# Patient Record
Sex: Female | Born: 1970 | Race: White | Hispanic: No | Marital: Married | Smoking: Never smoker
Health system: Southern US, Community
[De-identification: ages and names within clinical notes are randomized; demographics above are authoritative.]

## PROBLEM LIST (undated history)

## (undated) DIAGNOSIS — M51369 Other intervertebral disc degeneration, lumbar region without mention of lumbar back pain or lower extremity pain: Secondary | ICD-10-CM

## (undated) DIAGNOSIS — G950 Syringomyelia and syringobulbia: Secondary | ICD-10-CM

## (undated) DIAGNOSIS — J45909 Unspecified asthma, uncomplicated: Secondary | ICD-10-CM

## (undated) DIAGNOSIS — M5136 Other intervertebral disc degeneration, lumbar region: Secondary | ICD-10-CM

## (undated) HISTORY — PX: ANKLE SURGERY: SHX546

## (undated) HISTORY — DX: Syringomyelia and syringobulbia: G95.0

## (undated) HISTORY — PX: HIP SURGERY: SHX245

## (undated) HISTORY — DX: Other intervertebral disc degeneration, lumbar region without mention of lumbar back pain or lower extremity pain: M51.369

## (undated) HISTORY — DX: Other intervertebral disc degeneration, lumbar region: M51.36

---

## 2010-07-01 ENCOUNTER — Ambulatory Visit: Payer: Self-pay | Admitting: Family Medicine

## 2010-07-01 ENCOUNTER — Telehealth: Payer: Self-pay | Admitting: *Deleted

## 2010-07-01 DIAGNOSIS — Z0289 Encounter for other administrative examinations: Secondary | ICD-10-CM

## 2010-07-01 NOTE — Telephone Encounter (Signed)
No Show for new patient 30 min appt.  LMTCB with reason for no show on cell phone

## 2010-07-01 NOTE — Telephone Encounter (Signed)
noted 

## 2010-07-01 NOTE — Telephone Encounter (Signed)
Pt did not call back with reason for no -show

## 2017-10-12 ENCOUNTER — Other Ambulatory Visit: Payer: Self-pay | Admitting: Family Medicine

## 2017-10-12 DIAGNOSIS — Z1231 Encounter for screening mammogram for malignant neoplasm of breast: Secondary | ICD-10-CM

## 2019-08-27 ENCOUNTER — Other Ambulatory Visit: Payer: Self-pay

## 2019-08-27 ENCOUNTER — Other Ambulatory Visit (HOSPITAL_COMMUNITY): Payer: Self-pay | Admitting: Family Medicine

## 2019-08-27 ENCOUNTER — Ambulatory Visit (HOSPITAL_COMMUNITY)
Admission: RE | Admit: 2019-08-27 | Discharge: 2019-08-27 | Disposition: A | Discharge status: Home / Self Care | Payer: 59 | Source: Ambulatory Visit | Attending: Family Medicine | Admitting: Family Medicine

## 2019-08-27 DIAGNOSIS — M545 Low back pain, unspecified: Secondary | ICD-10-CM

## 2019-08-27 DIAGNOSIS — R0602 Shortness of breath: Secondary | ICD-10-CM

## 2021-05-06 IMAGING — DX DG LUMBAR SPINE COMPLETE 4+V
5 series · 5 of 5 positions shown · non-contrast
Comparison: None.

CLINICAL DATA: Low back pain.

EXAM:
LUMBAR SPINE - COMPLETE 4+ VIEW

[l-spine ap]
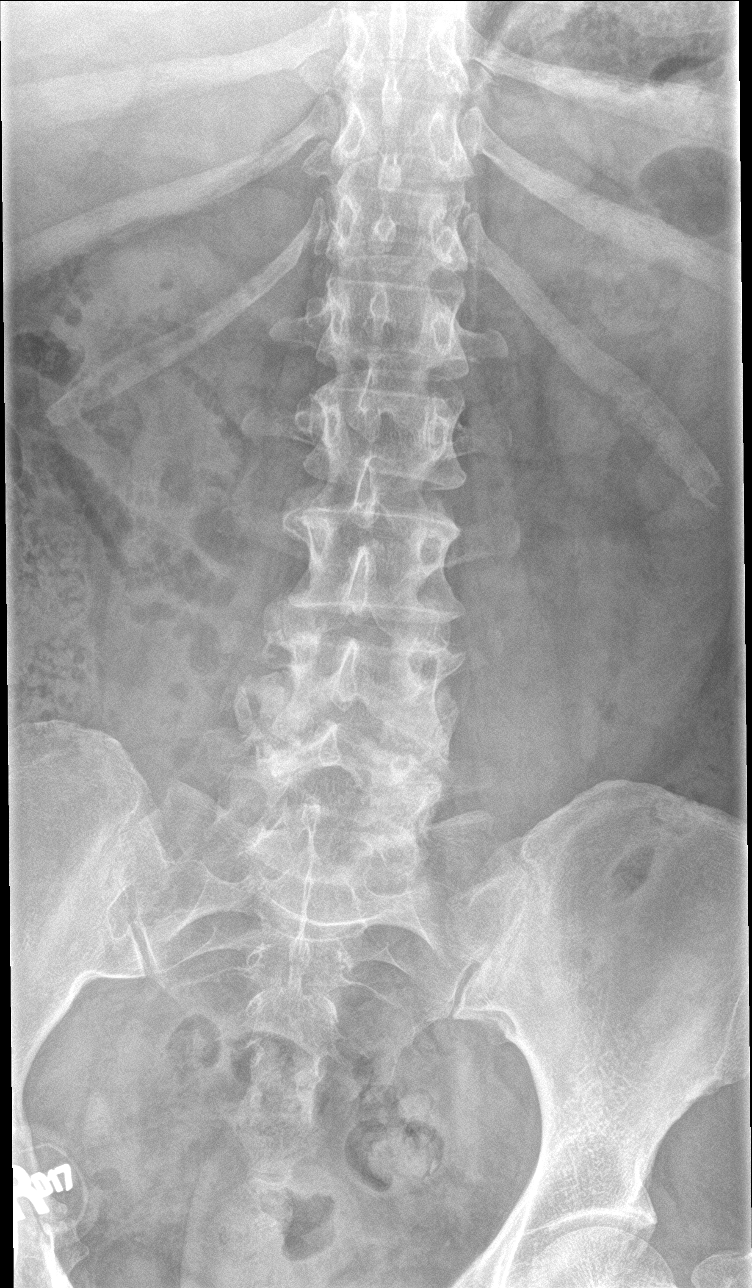

[l-spine obl (1 of 2)]
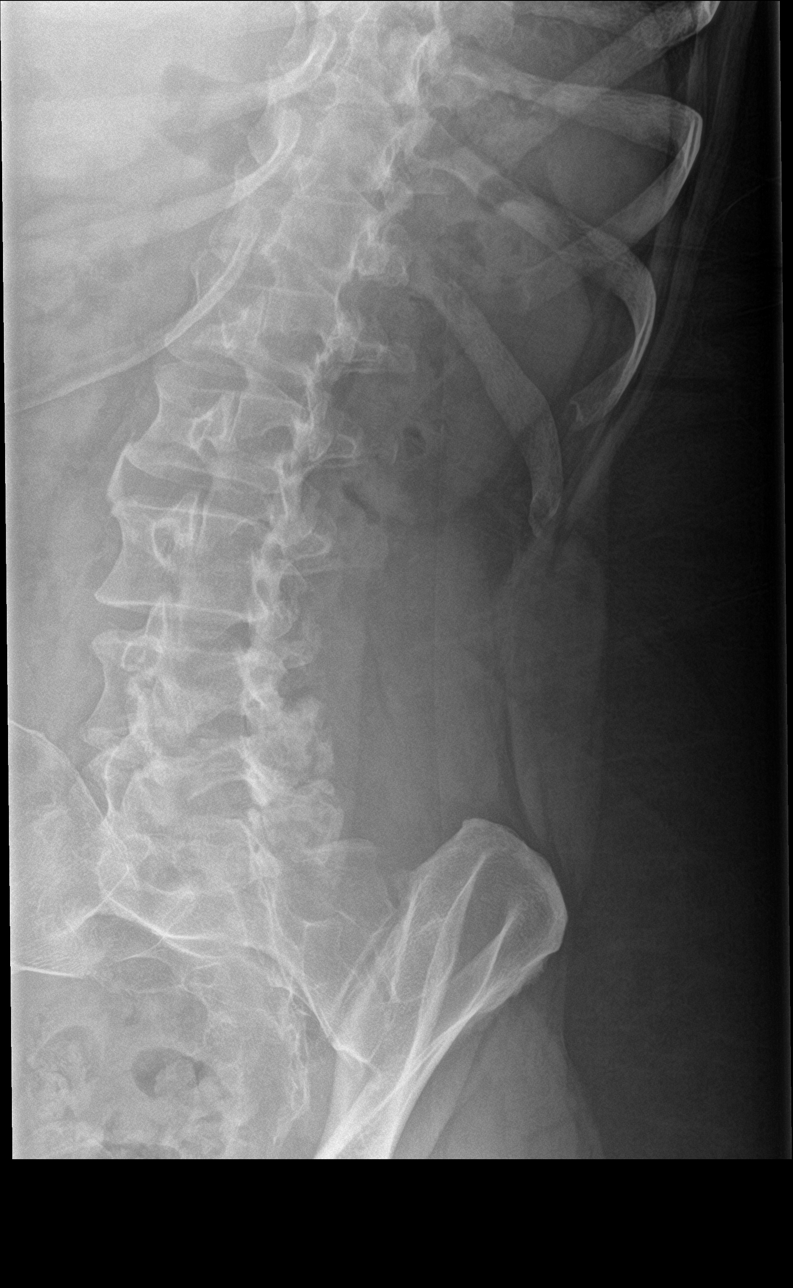

[l-spine obl (2 of 2)]
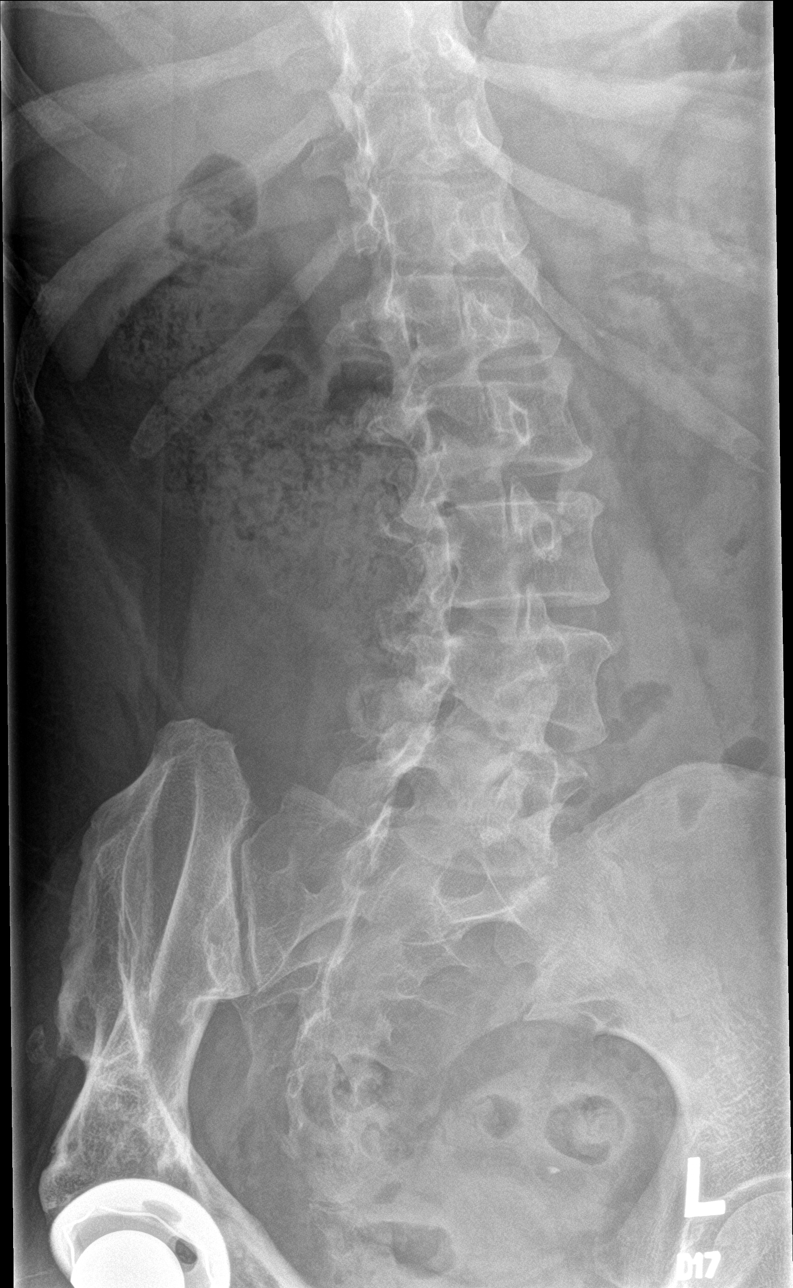

[l-spine lat]
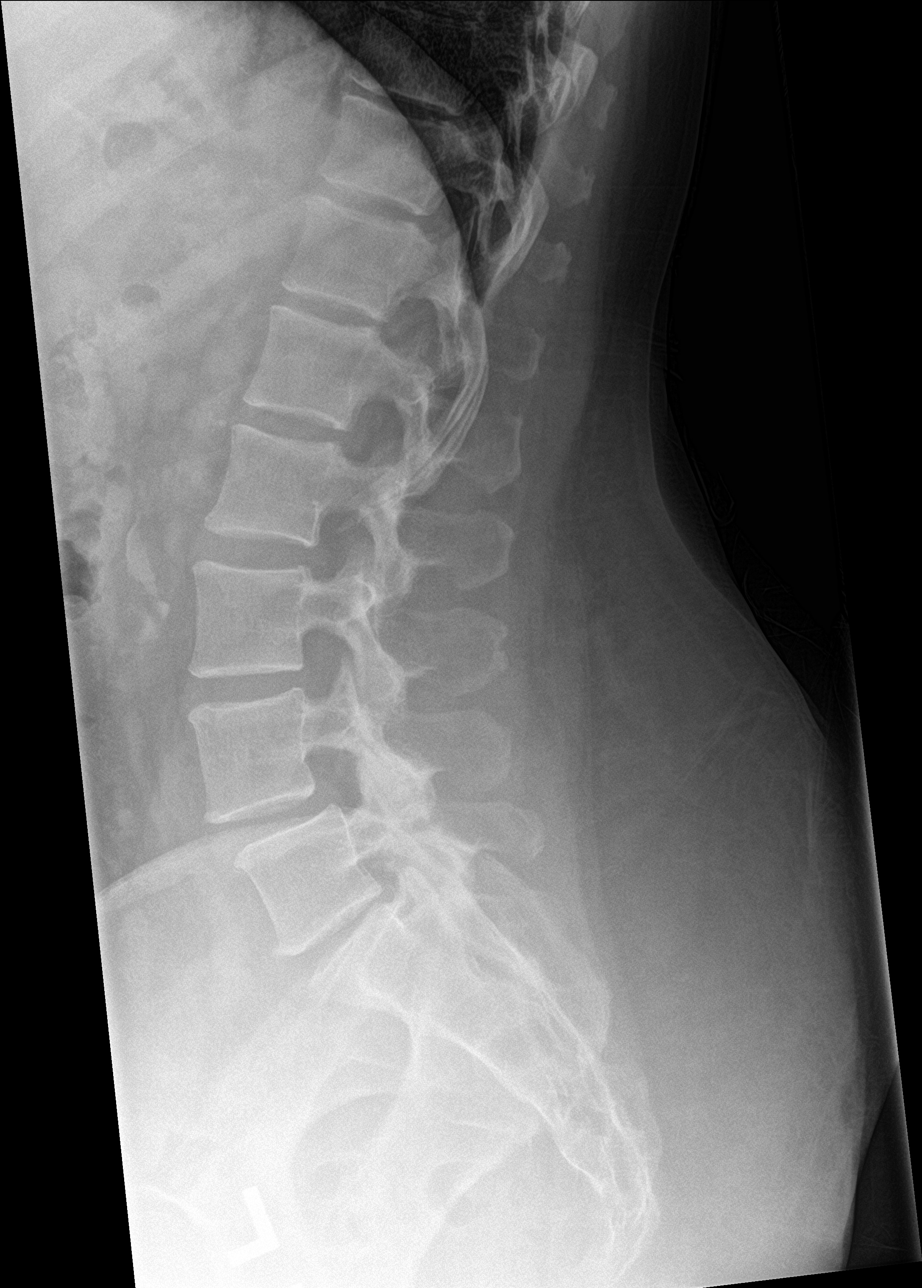

[l-spine spot]
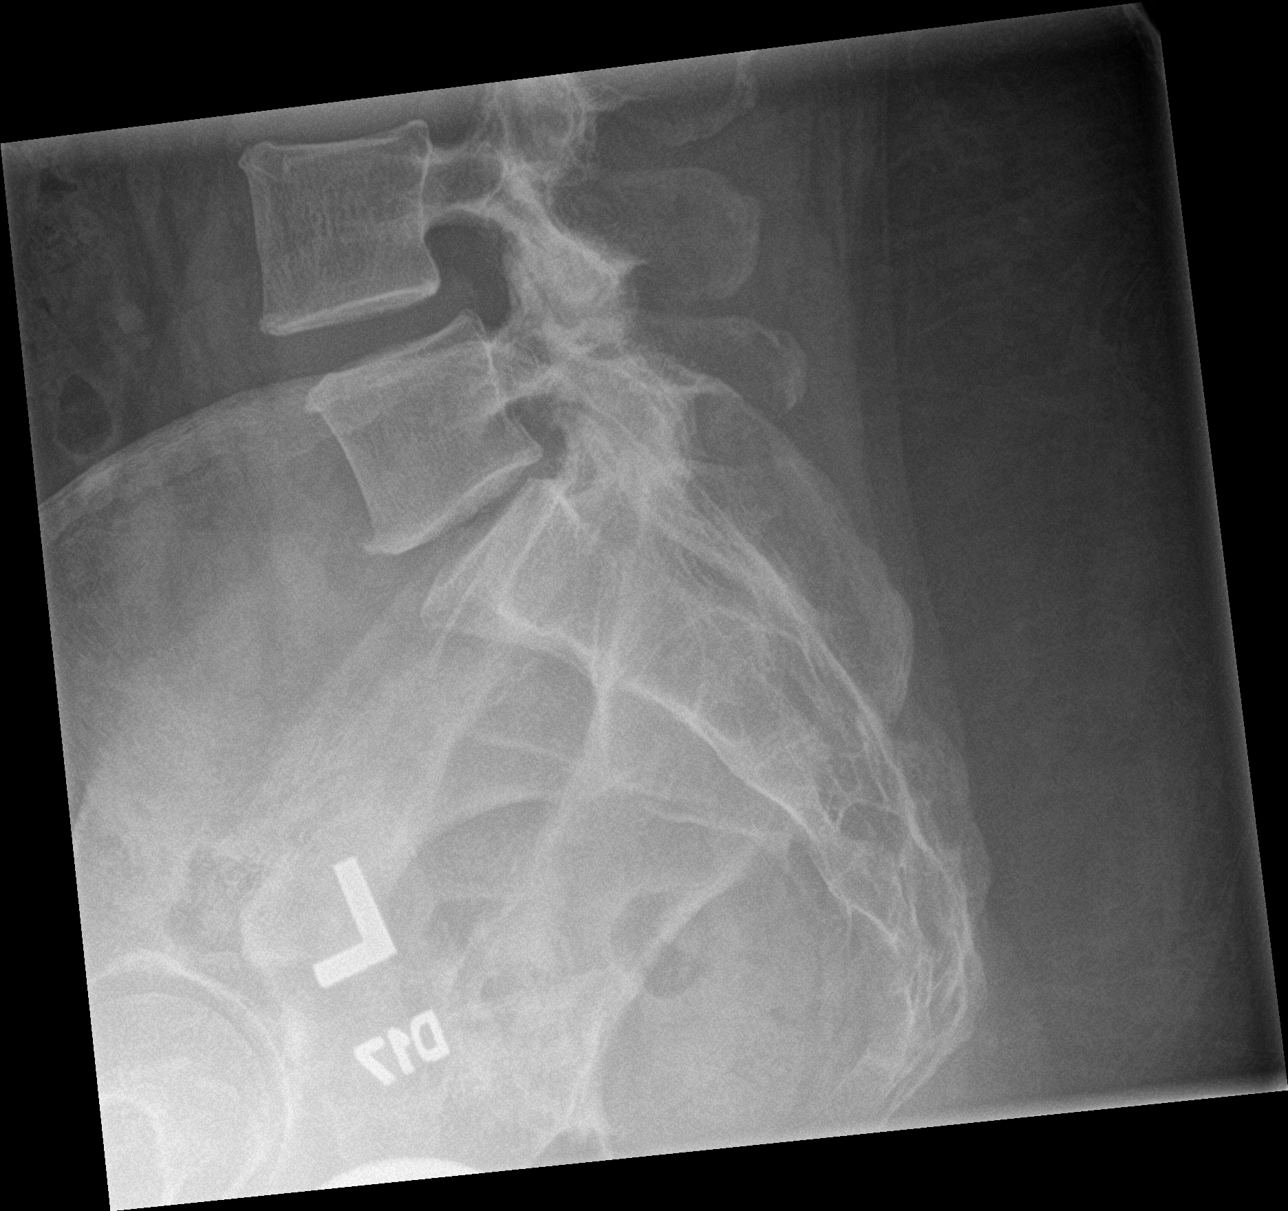

[5 of 5 positions shown; findings below may reference images not displayed]

FINDINGS: Five lumbar type vertebral bodies. No acute fracture or subluxation.
Vertebral body heights are preserved.

4 mm anterolisthesis at L4-L5. Mild disc height loss at L5-S1.
Remaining intervertebral disc spaces are maintained. Moderate
bilateral facet arthropathy at L4-L5.

The sacroiliac joints are unremarkable.
IMPRESSION: 1. Mild degenerative disc disease at L5-S1.
2. Moderate facet arthropathy at L4-L5 with grade 1 anterolisthesis.

## 2021-08-11 ENCOUNTER — Emergency Department (HOSPITAL_COMMUNITY): Payer: 59

## 2021-08-11 ENCOUNTER — Emergency Department (HOSPITAL_COMMUNITY)
Admission: EM | Admit: 2021-08-11 | Discharge: 2021-08-11 | Disposition: A | Discharge status: Home / Self Care | Payer: 59 | Attending: Emergency Medicine | Admitting: Emergency Medicine

## 2021-08-11 ENCOUNTER — Encounter (HOSPITAL_COMMUNITY): Payer: Self-pay | Admitting: Emergency Medicine

## 2021-08-11 ENCOUNTER — Other Ambulatory Visit: Payer: Self-pay

## 2021-08-11 DIAGNOSIS — K5792 Diverticulitis of intestine, part unspecified, without perforation or abscess without bleeding: Secondary | ICD-10-CM | POA: Insufficient documentation

## 2021-08-11 DIAGNOSIS — K529 Noninfective gastroenteritis and colitis, unspecified: Secondary | ICD-10-CM | POA: Insufficient documentation

## 2021-08-11 DIAGNOSIS — R1084 Generalized abdominal pain: Secondary | ICD-10-CM | POA: Diagnosis present

## 2021-08-11 DIAGNOSIS — D72829 Elevated white blood cell count, unspecified: Secondary | ICD-10-CM | POA: Insufficient documentation

## 2021-08-11 HISTORY — DX: Unspecified asthma, uncomplicated: J45.909

## 2021-08-11 LAB — COMPREHENSIVE METABOLIC PANEL
ALT: 28 U/L (ref 0–44)
AST: 26 U/L (ref 15–41)
Albumin: 4.2 g/dL (ref 3.5–5.0)
Alkaline Phosphatase: 79 U/L (ref 38–126)
Anion gap: 10 (ref 5–15)
BUN: 10 mg/dL (ref 6–20)
CO2: 26 mmol/L (ref 22–32)
Calcium: 9.1 mg/dL (ref 8.9–10.3)
Chloride: 103 mmol/L (ref 98–111)
Creatinine, Ser: 0.6 mg/dL (ref 0.44–1.00)
GFR, Estimated: >60 mL/min (ref >60)
Glucose, Bld: 100 mg/dL — ABNORMAL HIGH (ref 70–99)
Potassium: 3.8 mmol/L (ref 3.5–5.1)
Sodium: 139 mmol/L (ref 135–145)
Total Bilirubin: 0.1 mg/dL — ABNORMAL LOW (ref 0.3–1.2)
Total Protein: 8.1 g/dL (ref 6.5–8.1)

## 2021-08-11 LAB — CBC
HCT: 40.9 % (ref 36.0–46.0)
Hemoglobin: 13.4 g/dL (ref 12.0–15.0)
MCH: 28.8 pg (ref 26.0–34.0)
MCHC: 32.8 g/dL (ref 30.0–36.0)
MCV: 87.8 fL (ref 80.0–100.0)
Platelets: 466 10*3/uL — ABNORMAL HIGH (ref 150–400)
RBC: 4.66 MIL/uL (ref 3.87–5.11)
RDW: 12.9 % (ref 11.5–15.5)
WBC: 11 10*3/uL — ABNORMAL HIGH (ref 4.0–10.5)
nRBC: 0 % (ref 0.0–0.2)

## 2021-08-11 LAB — URINALYSIS, ROUTINE W REFLEX MICROSCOPIC
Bilirubin Urine: NEGATIVE
Glucose, UA: NEGATIVE mg/dL
Ketones, ur: NEGATIVE mg/dL
Leukocytes,Ua: NEGATIVE
Nitrite: NEGATIVE
Protein, ur: NEGATIVE mg/dL
Specific Gravity, Urine: 1.005 (ref 1.005–1.030)
pH: 6 (ref 5.0–8.0)

## 2021-08-11 LAB — LIPASE, BLOOD: Lipase: 45 U/L (ref 11–51)

## 2021-08-11 MED ORDER — IOHEXOL 300 MG/ML  SOLN
100.0000 mL | Freq: Once | INTRAMUSCULAR | Status: AC | PRN
  Administered 2021-08-11: 100 mL via INTRAVENOUS

## 2021-08-11 MED ORDER — HYDROCODONE-ACETAMINOPHEN 5-325 MG PO TABS: 1.0000 | ORAL_TABLET | ORAL | 0 refills | Status: AC | PRN

## 2021-08-11 MED ORDER — AMOXICILLIN-POT CLAVULANATE 875-125 MG PO TABS: 1.0000 | ORAL_TABLET | Freq: Two times a day (BID) | ORAL | 0 refills | Status: AC

## 2021-08-11 MED ORDER — ONDANSETRON HCL 4 MG/2ML IJ SOLN
4.0000 mg | Freq: Once | INTRAMUSCULAR | Status: AC
  Administered 2021-08-11: 4 mg via INTRAVENOUS
  Filled 2021-08-11: qty 2

## 2021-08-11 MED ORDER — ONDANSETRON 4 MG PO TBDP: 4.0000 mg | ORAL_TABLET | Freq: Three times a day (TID) | ORAL | 0 refills | Status: AC | PRN

## 2021-08-11 MED ORDER — AMOXICILLIN-POT CLAVULANATE 875-125 MG PO TABS
1.0000 | ORAL_TABLET | Freq: Once | ORAL | Status: AC
Start: 2021-08-11 — End: 2021-08-11
  Administered 2021-08-11: 1 via ORAL
  Filled 2021-08-11: qty 1

## 2021-08-11 MED ORDER — MORPHINE SULFATE (PF) 4 MG/ML IV SOLN
4.0000 mg | Freq: Once | INTRAVENOUS | Status: AC
  Administered 2021-08-11: 4 mg via INTRAVENOUS
  Filled 2021-08-11: qty 1

## 2021-08-11 NOTE — ED Triage Notes (Signed)
Pt having left-sided abdominal pain that started on Sunday.

## 2021-08-11 NOTE — Discharge Instructions (Addendum)
Return if any problems.  Schedule to see Gi for evaltuion

## 2021-08-11 NOTE — ED Provider Notes (Signed)
[facility]Y [facility]PART[facility] Provider Note   CSN: [id number] Arrival date & time: 08/11/21  1555     History  Chief Complaint  Patient presents with   Abdominal Pain    Sarah Lopez is a 51 y.o. female.  Pt complains of pain in her left lower abdomen.  Pt reports severe cramping pain  that is worsening   The history is provided by the patient. No language interpreter was used.  Abdominal Pain Pain location:  Generalized Pain quality: aching   Pain radiates to:  Does not radiate Pain severity:  Moderate Onset quality:  Sudden Duration:  3 days Timing:  Constant Progression:  Worsening Chronicity:  New Context: not recent illness and not sick contacts   Relieved by:  Nothing Worsened by:  Nothing Ineffective treatments:  None tried Associated symptoms: no anorexia and no fever   Risk factors: no alcohol abuse       Home Medications Prior to Admission medications   Not on File      Allergies    Aspirin and Percocet [oxycodone-acetaminophen]    Review of Systems   Review of Systems  Constitutional:  Negative for fever.  Gastrointestinal:  Positive for abdominal pain. Negative for anorexia.  All other systems reviewed and are negative.  Physical Exam Updated Vital Signs BP (!) 155/81 (BP Location: Left Arm)   Pulse 98   Temp 98 F (36.7 C) (Oral)   Resp 18   Ht 5\' 4"  (1.626 m)   Wt 86.2 kg   LMP 07/13/2021 (Approximate)   SpO2 100%   BMI 32.61 kg/m  Physical Exam Vitals and nursing note reviewed.  Constitutional:      Appearance: She is well-developed.  HENT:     Head: Normocephalic.  Eyes:     Extraocular Movements: Extraocular movements intact.  Cardiovascular:     Rate and Rhythm: Normal rate and regular rhythm.  Pulmonary:     Effort: Pulmonary effort is normal.  Abdominal:     General: Abdomen is flat. There is abdominal bruit. There is no distension.     Palpations: Abdomen is soft.     Tenderness: There is abdominal tenderness in  the right lower quadrant.  Musculoskeletal:        General: Normal range of motion.     Cervical back: Normal range of motion.  Skin:    General: Skin is warm.  Neurological:     General: No focal deficit present.     Mental Status: She is alert and oriented to person, place, and time.    ED Results / Procedures / Treatments   Labs (all labs ordered are listed, but only abnormal results are displayed) Labs Reviewed  COMPREHENSIVE METABOLIC PANEL - Abnormal; Notable for the following components:      Result Value   Glucose, Bld 100 (*)    Total Bilirubin 0.1 (*)    All other components within normal limits  CBC - Abnormal; Notable for the following components:   WBC 11.0 (*)    Platelets 466 (*)    All other components within normal limits  URINALYSIS, ROUTINE W REFLEX MICROSCOPIC - Abnormal; Notable for the following components:   Color, Urine STRAW (*)    Hgb urine dipstick MODERATE (*)    Bacteria, UA RARE (*)    All other components within normal limits  LIPASE, BLOOD    EKG None  Radiology CT ABDOMEN PELVIS W CONTRAST  Result Date: 08/11/2021 CLINICAL DATA:  Left lower quadrant  abdominal pain. EXAM: CT ABDOMEN AND PELVIS WITH CONTRAST TECHNIQUE: Multidetector CT imaging of the abdomen and pelvis was performed using the standard protocol following bolus administration of intravenous contrast. RADIATION DOSE REDUCTION: This exam was performed according to the departmental dose-optimization program which includes automated exposure control, adjustment of the mA and/or kV according to patient size and/or use of iterative reconstruction technique. CONTRAST:  OMNIPAQUE IOHEXOL 300 MG/ML  SOLN COMPARISON:  None Available. FINDINGS: Lower chest: No acute abnormality. Hepatobiliary: No suspicious hepatic lesion. Cholelithiasis without findings of acute cholecystitis. No biliary ductal dilation. Pancreas: No pancreatic ductal dilation or evidence of acute inflammation. Spleen:  No splenomegaly or focal splenic lesion. Adrenals/Urinary Tract: Bilateral adrenal glands appear normal. No hydronephrosis. Kidneys demonstrate symmetric enhancement and excretion of contrast material. Urinary bladder is unremarkable for degree of distension. Stomach/Bowel: No radiopaque enteric contrast material was administered. Stomach is unremarkable for degree of distension. No pathologic dilation of small or large bowel. Left-sided colonic diverticulosis with descending colonic diverticulitis. Vascular/Lymphatic: Aortic atherosclerosis without aneurysmal dilation. No pathologically enlarged abdominal or pelvic lymph nodes. Reproductive: Endometrial thickening with fluid in the endometrial canal. No suspicious adnexal mass. Other: No significant abdominopelvic free fluid. No walled off fluid collections. No pneumoperitoneum. 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 L5-S1 discogenic disease. IMPRESSION: 1. Acute uncomplicated descending colonic diverticulitis. 2. Endometrial thickening with fluid in the endometrial canal, nonspecific and poorly evaluated on CT imaging. Consider further evaluation with nonemergent pelvic ultrasound. 3. 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 4. Cholelithiasis without findings of acute cholecystitis. 5. Aortic Atherosclerosis (ICD10-I70.0). Electronically Signed   By: Maudry Mayhew M.D.   On: 08/11/2021 18:46    Procedures Procedures    Medications Ordered in ED Medications  morphine (PF) 4 MG/ML injection 4 mg (4 mg Intravenous Given 08/11/21 1800)  ondansetron (ZOFRAN) injection 4 mg (4 mg Intravenous Given 08/11/21 1759)  iohexol (OMNIPAQUE) 300 MG/ML solution 100 mL (100 mLs Intravenous Contrast Given 08/11/21 1825)     ED Course/ Medical Decision Making/ A&P                           Medical Decision Making Pt complains of pain left lower abdomen   Amount and/or Complexity of Data Reviewed Labs: ordered. Decision-making details documented in ED Course.    Details: Labs ordered reviewed and interpreted patient has a  white blood cell count of 11 hemoglobin is normal.  UA hemoglobin moderate bacteria Radiology: ordered and independent interpretation performed. Decision-making details documented in ED Course.    Details: CT abdomen shows diverticulitis and colitis without evidence of cholecystitis.  Risk Prescription drug management. Risk Details: Differential diagnosis includes kidney stone possible pain from hydronephrosis, diverticulitis, colitis.   The scan showed diverticulitis patient is given a prescription for 875 number 21 tablet p.o. twice daily patient is referred to GI for follow-up is given a prescription for Zofran for nausea and hydrocodone for pain           Final Clinical Impression(s) / ED Diagnoses Final diagnoses:  Diverticulitis    Rx / DC Orders ED Discharge Orders          Ordered    amoxicillin-clavulanate (AUGMENTIN) 875-125 MG tablet  2 times daily        08/11/21  1918    ondansetron (ZOFRAN-ODT) 4 MG disintegrating tablet  Every 8 hours PRN        08/11/21 1918    HYDROcodone-acetaminophen (NORCO/VICODIN) 5-325 MG tablet  Every 4 hours PRN        08/11/21 1918          An After Visit Summary was printed and given to the patient.     Elson Areas, PA-C 08/11/21 1919    Edwin Dada P, DO 08/16/21 2328

## 2021-08-20 ENCOUNTER — Other Ambulatory Visit (INDEPENDENT_AMBULATORY_CARE_PROVIDER_SITE_OTHER): Payer: Self-pay

## 2021-08-20 ENCOUNTER — Encounter (INDEPENDENT_AMBULATORY_CARE_PROVIDER_SITE_OTHER): Payer: Self-pay | Admitting: Gastroenterology

## 2021-08-20 ENCOUNTER — Ambulatory Visit (INDEPENDENT_AMBULATORY_CARE_PROVIDER_SITE_OTHER): Payer: 59 | Admitting: Gastroenterology

## 2021-08-20 ENCOUNTER — Telehealth (INDEPENDENT_AMBULATORY_CARE_PROVIDER_SITE_OTHER): Payer: Self-pay

## 2021-08-20 ENCOUNTER — Encounter (INDEPENDENT_AMBULATORY_CARE_PROVIDER_SITE_OTHER): Payer: Self-pay

## 2021-08-20 VITALS — BP 136/71 | HR 102 | Temp 98.0°F | Ht 64.0 in | Wt 204.7 lb

## 2021-08-20 DIAGNOSIS — K5732 Diverticulitis of large intestine without perforation or abscess without bleeding: Secondary | ICD-10-CM

## 2021-08-20 MED ORDER — PEG 3350-KCL-NA BICARB-NACL 420 G PO SOLR: 4000.0000 mL | ORAL | 0 refills | Status: AC

## 2021-08-20 NOTE — Progress Notes (Signed)
Referring Provider: No ref. provider found Primary Care Physician:  Pcp, No Primary GI Physician: new  Chief Complaint  Patient presents with   Diverticulitis    Hospital follow up on diverticulitis. Pain is better. Having some soreness, having nausea, diarrhea. Has noticed red meat and lettuce has caused diarrhea within 10 minutes of eating.    HPI:   Sarah Lopez is a 51 y.o. female with past medical history of asthma, DDD, syringomyelia.  Patient presenting today as a new patient for diverticulitis.  Patient with recent ER visit 08/01/21 with c/o LLQ pain x3 days, CT A/P with contrast at that time showed Acute uncomplicated descending colonic diverticulitis. Patient was treated with  Augmentin BID  Patient states that prior to her ER visit, she had sudden onset of lower abdominal cramping with worsening pain on the left side. She states that her husband was out of town, when she drove to [place] to get him, every bump she drove over made her pain worse, she proceeded to the ER for further evaluation with findings as above. She reports that pain is improving some. Denies rectal bleeding or black stools. She is slowly reintroducing foods as she was on a liquid diet previously. She states that she may have up to 6 BMs per day since acute illness. At baseline she has 1-2 BMs per day. Stools are anywhere from loose to formed. She reports that she previously thought she had an allergy to beef as eating this made her have diarrhea. She ate beef, sunflower seeds and some veggies just prior to her acute episode. Denies any fevers or chills during acute episode. Did have a lot of nausea with her belly pain but no vomiting. Has been taking zofran that was given to her in the ED but she is trying to avoid taking nausea or pain meds too often.    NSAID use: taking ibuprofen occasionally, will sometimes take 2 ibuprofen for pain Social hx: no tobacco, rare etoh.  Fam hx: no crc or liver disease.    Last Colonoscopy: never Last Endoscopy:  Recommendations:    Past Medical History:  Diagnosis Date   Asthma    Degenerative disc disease, lumbar    Syringomyelia (HCC)     Current Outpatient Medications  Medication Sig Dispense Refill   amoxicillin-clavulanate (AUGMENTIN) 875-125 MG tablet Take 1 tablet by mouth 2 (two) times daily. 20 tablet 0   ondansetron (ZOFRAN-ODT) 4 MG disintegrating tablet Take 1 tablet (4 mg total) by mouth every 8 (eight) hours as needed for nausea or vomiting. 20 tablet 0   HYDROcodone-acetaminophen (NORCO/VICODIN) 5-325 MG tablet Take 1 tablet by mouth every 4 (four) hours as needed for moderate pain. (Patient not taking: Reported on 08/20/2021) 20 tablet 0   No current facility-administered medications for this visit.    Allergies as of 08/20/2021 - Review Complete 08/20/2021  Allergen Reaction Noted   Aspirin Nausea And Vomiting 08/11/2021   Hydrocodone  08/20/2021   Percocet [oxycodone-acetaminophen] Other (See Comments) 08/11/2021   Pineapple  08/20/2021    No family history on file.  Social History   Socioeconomic History   Marital status: Married    Spouse name: Not on file   Number of children: Not on file   Years of education: Not on file   Highest education level: Not on file  Occupational History   Not on file  Tobacco Use   Smoking status: Never    Passive exposure: Past   Smokeless tobacco: Never  Tobacco comments:    Smoked a couple of cigarrettes as a teenager  Vaping Use   Vaping Use: Never used  Substance and Sexual Activity   Alcohol use: Not Currently   Drug use: Never   Sexual activity: Not on file  Other Topics Concern   Not on file  Social History Narrative   Not on file   Social Determinants of Health   Financial Resource Strain: Not on file  Food Insecurity: Not on file  Transportation Needs: Not on file  Physical Activity: Not on file  Stress: Not on file  Social Connections: Not on file    Review of systems General: negative for malaise, night sweats, fever, chills, weight loss Neck: Negative for lumps, goiter, pain and significant neck swelling Resp: Negative for cough, wheezing, dyspnea at rest CV: Negative for chest pain, leg swelling, palpitations, orthopnea GI: denies melena, hematochezia, nausea, vomiting, dysphagia, odyonophagia, early satiety or unintentional weight loss. +mid to LLQ abdominal pain +looser to formed stools MSK: Negative for joint pain or swelling, back pain, and muscle pain. Derm: Negative for itching or rash Psych: Denies depression, anxiety, memory loss, confusion. No homicidal or suicidal ideation.  Heme: Negative for prolonged bleeding, bruising easily, and swollen nodes. Endocrine: Negative for cold or heat intolerance, polyuria, polydipsia and goiter. Neuro: negative for tremor, gait imbalance, syncope and seizures. The remainder of the review of systems is noncontributory.  Physical Exam: BP 136/71 (BP Location: Right Arm, Patient Position: Sitting, Cuff Size: Large)   Pulse (!) 102   Temp 98 F (36.7 C) (Oral)   Ht 5\' 4"  (1.626 m)   Wt 204 lb 11.2 oz (92.9 kg)   LMP 07/13/2021 (Approximate)   BMI 35.14 kg/m  General:   Alert and oriented. No distress noted. Pleasant and cooperative.  Head:  Normocephalic and atraumatic. Eyes:  Conjuctiva clear without scleral icterus. Mouth:  Oral mucosa pink and moist. Good dentition. No lesions. Heart: Normal rate and rhythm, s1 and s2 heart sounds present.  Lungs: Clear lung sounds in all lobes. Respirations equal and unlabored. Abdomen:  +BS, soft, non distended. TTP of mid to LLQ.  No rebound or guarding. No HSM or masses noted. Derm: No palmar erythema or jaundice Msk:  Symmetrical without gross deformities. Normal posture. Extremities:  Without edema. Neurologic:  Alert and  oriented x4 Psych:  Alert and cooperative. Normal mood and affect.  Invalid input(s): "6 MONTHS"    ASSESSMENT: Sarah Lopez is a 51 y.o. female presenting today as a new patient for diverticulitis.  Acute onset of lower abdominal cramping, worse in LLQ at the end of May with CT with findings of uncomplicated descending diverticulitis, started on Augmentin BID x10 days. Patient continues to have some mid to LLQ pain, ranging with solid to looser stools without rectal bleeding or melena, though pain is improving. She is doing a low fiber/bland/soft diet at this time and should continue this until resolution of symptoms, at that time she can transition to a high fiber diet. I discussed that recent studies indicate no correlation with seeds/nuts and development of acute diverticulitis, she is okay to proceed with consuming these once she is feeling better as long as she tolerates them okay. She uses ibuprofen on occasion, should avoid NSAIDs for now, can use sparingly once acute episode resolved. We discussed indications of proceeding with colonoscopy 6-8 weeks after acute diverticulitis as in rare cases malignancy can masquerade as diverticulitis. Notably she has never had a colonoscopy in the past,  she denies any family history of CRC. Indications, risks and benefits of procedure discussed in detail with patient. Patient verbalized understanding and is in agreement to proceed with Colonoscopy at this time. She will make me aware if she has new or worsening GI symptoms.    PLAN:  Plan for colonoscopy after July 11th 2. Soft diet, can transition to high fiber diet one symptoms completely resolved 3. Can do phazyme or gas x for gas 4. Avoid NSAIDs during acute episode, use very sparingly thereafter.  All questions were answered, patient verbalized understanding and is in agreement with plan as outlined above.   Follow Up: 4 months  Antrice Pal L. Jeanmarie Hubert, MSN, APRN, AGNP-C Adult-Gerontology Nurse Practitioner [facility]

## 2021-08-20 NOTE — Telephone Encounter (Signed)
Nolene Rocks Ann Gabino Hagin, CMA  ?

## 2021-08-20 NOTE — Patient Instructions (Signed)
We will plan for colonoscopy after July 11th As discussed it is important to follow an acute episode of diverticulitis with a colonoscopy to ensure healing and that there is no underlying malignancy(this is very rare). Please complete all antibiotics that you were given You should continue soft/bland/low fiber diet until you are no longer having abdominal pain or looser stools After resolution of diverticulitis, you should try to eat a high fiber diet as this is helpful in preventing further episodes of diverticulitis Recent studies show that there is no correlation between diverticulitis and seeds/nuts, if you tolerate these okay, you can resume consumption of these once symptoms are resolved. For the gas, you can try over the counter gas x or phazyme Please continue to avoid NSAIDs at this time, you can use these sparingly once symptoms have resolved but be sure to avoid frequent or high doses.  If you develop any new or worsening GI symptoms, please let me know  Follow up 4 months

## 2021-08-21 ENCOUNTER — Encounter (INDEPENDENT_AMBULATORY_CARE_PROVIDER_SITE_OTHER): Payer: Self-pay

## 2021-09-28 NOTE — Patient Instructions (Signed)
Niva Murren  09/28/2021     @PREFPERIOPPHARMACY @   Your procedure is scheduled on  10/02/2021.   Report to 10/04/2021 at  0730  A.M.   Call this number if you have problems the morning of surgery:  [phone]   Remember:  Follow the diet and prep instructions given to you by the office.     Take these medicines the morning of surgery with A SIP OF WATER                                 zofran (if needed),     Do not wear jewelry, make-up or nail polish.  Do not wear lotions, powders, or perfumes, or deodorant.  Do not shave 48 hours prior to surgery.  Men may shave face and neck.  Do not bring valuables to the hospital.  [facility] is not responsible for any belongings or valuables.  Contacts, dentures or bridgework may not be worn into surgery.  Leave your suitcase in the car.  After surgery it may be brought to your room.  For patients admitted to the hospital, discharge time will be determined by your treatment team.  Patients discharged the day of surgery will not be allowed to drive home and must have someone with them for 24 hours.    Special instructions:   DO NOT smoke tobacco or vape for 24 hours before your procedure.  Please read over the following fact sheets that you were given. Anesthesia Post-op Instructions and Care and Recovery After Surgery      Colonoscopy, Adult, Care After The following information offers guidance on how to care for yourself after your procedure. Your health care provider may also give you more specific instructions. If you have problems or questions, contact your health care provider. What can I expect after the procedure? After the procedure, it is common to have: A small amount of blood in your stool for 24 hours after the procedure. Some gas. Mild cramping or bloating of your abdomen. Follow these instructions at home: Eating and drinking  Drink enough fluid to keep your urine pale yellow. Follow instructions  from your health care provider about eating or drinking restrictions. Resume your normal diet as told by your health care provider. Avoid heavy or fried foods that are hard to digest. Activity Rest as told by your health care provider. Avoid sitting for a long time without moving. Get up to take short walks every 1-2 hours. This is important to improve blood flow and breathing. Ask for help if you feel weak or unsteady. Return to your normal activities as told by your health care provider. Ask your health care provider what activities are safe for you. Managing cramping and bloating  Try walking around when you have cramps or feel bloated. If directed, apply heat to your abdomen as told by your health care provider. Use the heat source that your health care provider recommends, such as a moist heat pack or a heating pad. Place a towel between your skin and the heat source. Leave the heat on for 20-30 minutes. Remove the heat if your skin turns bright red. This is especially important if you are unable to feel pain, heat, or cold. You have a greater risk of getting burned. General instructions If you were given a sedative during the procedure, it can affect you for several hours. Do not  drive or operate machinery until your health care provider says that it is safe. For the first 24 hours after the procedure: Do not sign important documents. Do not drink alcohol. Do your regular daily activities at a slower pace than normal. Eat soft foods that are easy to digest. Take over-the-counter and prescription medicines only as told by your health care provider. Keep all follow-up visits. This is important. Contact a health care provider if: You have blood in your stool 2-3 days after the procedure. Get help right away if: You have more than a small spotting of blood in your stool. You have large blood clots in your stool. You have swelling of your abdomen. You have nausea or vomiting. You have a  fever. You have increasing pain in your abdomen that is not relieved with medicine. These symptoms may be an emergency. Get help right away. Call 911. Do not wait to see if the symptoms will go away. Do not drive yourself to the hospital. Summary After the procedure, it is common to have a small amount of blood in your stool. You may also have mild cramping and bloating of your abdomen. If you were given a sedative during the procedure, it can affect you for several hours. Do not drive or operate machinery until your health care provider says that it is safe. Get help right away if you have a lot of blood in your stool, nausea or vomiting, a fever, or increased pain in your abdomen. This information is not intended to replace advice given to you by your health care provider. Make sure you discuss any questions you have with your health care provider. Document Revised: 10/22/2020 Document Reviewed: 10/22/2020 Elsevier Patient Education  [place] After This sheet gives you information about how to care for yourself after your procedure. Your health care provider may also give you more specific instructions. If you have problems or questions, contact your health care provider. What can I expect after the procedure? After the procedure, it is common to have: Tiredness. Forgetfulness about what happened after the procedure. Impaired judgment for important decisions. Nausea or vomiting. Some difficulty with balance. Follow these instructions at home: For the time period you were told by your health care provider:     Rest as needed. Do not participate in activities where you could fall or become injured. Do not drive or use machinery. Do not drink alcohol. Do not take sleeping pills or medicines that cause drowsiness. Do not make important decisions or sign legal documents. Do not take care of children on your own. Eating and drinking Follow the  diet that is recommended by your health care provider. Drink enough fluid to keep your urine pale yellow. If you vomit: Drink water, juice, or soup when you can drink without vomiting. Make sure you have little or no nausea before eating solid foods. General instructions Have a responsible adult stay with you for the time you are told. It is important to have someone help care for you until you are awake and alert. Take over-the-counter and prescription medicines only as told by your health care provider. If you have sleep apnea, surgery and certain medicines can increase your risk for breathing problems. Follow instructions from your health care provider about wearing your sleep device: Anytime you are sleeping, including during daytime naps. While taking prescription pain medicines, sleeping medicines, or medicines that make you drowsy. Avoid smoking. Keep all follow-up visits as told by  your health care provider. This is important. Contact a health care provider if: You keep feeling nauseous or you keep vomiting. You feel light-headed. You are still sleepy or having trouble with balance after 24 hours. You develop a rash. You have a fever. You have redness or swelling around the IV site. Get help right away if: You have trouble breathing. You have new-onset confusion at home. Summary For several hours after your procedure, you may feel tired. You may also be forgetful and have poor judgment. Have a responsible adult stay with you for the time you are told. It is important to have someone help care for you until you are awake and alert. Rest as told. Do not drive or operate machinery. Do not drink alcohol or take sleeping pills. Get help right away if you have trouble breathing, or if you suddenly become confused. This information is not intended to replace advice given to you by your health care provider. Make sure you discuss any questions you have with your health care  provider. Document Revised: 02/03/2021 Document Reviewed: 02/01/2019 Elsevier Patient Education  [place].

## 2021-09-30 ENCOUNTER — Encounter (HOSPITAL_COMMUNITY): Payer: Self-pay

## 2021-09-30 ENCOUNTER — Encounter (HOSPITAL_COMMUNITY)
Admission: RE | Admit: 2021-09-30 | Discharge: 2021-09-30 | Disposition: A | Discharge status: Home / Self Care | Payer: 59 | Source: Ambulatory Visit | Attending: Gastroenterology | Admitting: Gastroenterology

## 2021-09-30 DIAGNOSIS — Z01818 Encounter for other preprocedural examination: Secondary | ICD-10-CM

## 2021-09-30 NOTE — Pre-Procedure Instructions (Signed)
Message to Sarah Lopez, patient did not show for her pre-op.

## 2021-10-02 ENCOUNTER — Encounter (HOSPITAL_COMMUNITY): Admission: RE | Payer: Self-pay | Source: Home / Self Care

## 2021-10-02 ENCOUNTER — Ambulatory Visit (HOSPITAL_COMMUNITY): Admission: RE | Admit: 2021-10-02 | Payer: 59 | Source: Home / Self Care | Admitting: Gastroenterology

## 2021-10-02 SURGERY — COLONOSCOPY WITH PROPOFOL
Anesthesia: Monitor Anesthesia Care

## 2021-12-24 ENCOUNTER — Ambulatory Visit (INDEPENDENT_AMBULATORY_CARE_PROVIDER_SITE_OTHER): Payer: 59 | Admitting: Gastroenterology
